# Patient Record
Sex: Female | Born: 1985 | Race: White | Hispanic: No | Marital: Married | Smoking: Current every day smoker
Health system: Southern US, Community
[De-identification: ages and names within clinical notes are randomized; demographics above are authoritative.]

## PROBLEM LIST (undated history)

## (undated) DIAGNOSIS — F419 Anxiety disorder, unspecified: Secondary | ICD-10-CM

---
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
## 2012-05-09 ENCOUNTER — Ambulatory Visit (INDEPENDENT_AMBULATORY_CARE_PROVIDER_SITE_OTHER): Payer: 59 | Admitting: Family Medicine

## 2012-05-09 ENCOUNTER — Ambulatory Visit: Payer: 59

## 2012-05-09 VITALS — BP 126/88 | HR 87 | Temp 98.5°F | Resp 16 | Ht 67.6 in | Wt 108.8 lb

## 2012-05-09 DIAGNOSIS — M79673 Pain in unspecified foot: Secondary | ICD-10-CM

## 2012-05-09 DIAGNOSIS — M79609 Pain in unspecified limb: Secondary | ICD-10-CM

## 2012-05-09 DIAGNOSIS — S9030XA Contusion of unspecified foot, initial encounter: Secondary | ICD-10-CM

## 2012-05-09 MED ORDER — MELOXICAM 7.5 MG PO TABS: 7.5000 mg | ORAL_TABLET | Freq: Every day | ORAL | Status: DC

## 2012-05-09 NOTE — Progress Notes (Signed)
  Subjective:    Patient ID: Alexandria Bell, female    DOB: 04-Dec-1985, 27 y.o.   MRN: [id number]  HPI Alexandria Bell is a 27 y.o. female Stomping L foot about 11 days ago - out of frustration - barefoot. Sore in heel.  No bruising, but some swelling. Wrapping bottom with ace bandage - able to walk, but limping.   Had been taking some pain medicine form having tooth pulled - 8 days ago - wasn't having any pain in foot on the pain medicine - sick on the pain medicine - hydrocodone. Noticed more pain in heel off pain meds. No prior heel injury, no hx of stress fx or other fx.  [employer] work - on Hospital doctor - standing at work, walking all day.   NG:[id number] ibuprofen.   Review of Systems  Constitutional: Negative for fever and chills.  Musculoskeletal: Positive for arthralgias (L heel).  Skin: Negative for rash.       Objective:   Physical Exam  Constitutional: She is oriented to person, place, and time. She appears well-developed and well-nourished. No distress.  Pulmonary/Chest: Effort normal.  Musculoskeletal:       Feet:  Neurological: She is alert and oriented to person, place, and time.  Skin: Skin is warm and dry. No rash noted.  Psychiatric: She has a normal mood and affect. Her behavior is normal.     .[facility] reading (PRIMARY) by  Dr. Neva Seat: L calcaneus: NAD. No fx identified.    Assessment & Plan:  Alexandria Bell is a 27 y.o. female 1. Heel pain  DG Os Calcis Left, meloxicam (MOBIC) 7.5 MG tablet  2. Contusion of heel  meloxicam (MOBIC) 7.5 MG tablet  overall reassuring exam - tx as below, but advised MRI may be needed if not improving with cushioning and relative rest to r/o stress injury. rtc precautions discussed.   Patient Instructions  Cushion the heel with a silicone or other cushioned heel pad.  One option is a Tuli heel cup.  They should have those at [facility] [employer] [facility]. Decrease walking as able, ibuprofen over the counter as  needed OR prescribed pain medicine - do not combine these. If not improving in next 2 weeks - recheck.  Return to the clinic or go to the nearest emergency room if any of your symptoms worsen or new symptoms occur.

## 2012-05-09 NOTE — Patient Instructions (Signed)
Cushion the heel with a silicone or other cushioned heel pad.  One option is a Tuli heel cup.  They should have those at [employer]. Decrease walking as able, ibuprofen over the counter as needed OR prescribed pain medicine - do not combine these. If not improving in next 2 weeks - recheck.  Return to the clinic or go to the nearest emergency room if any of your symptoms worsen or new symptoms occur.

## 2012-11-15 ENCOUNTER — Emergency Department (HOSPITAL_COMMUNITY)
Admission: EM | Admit: 2012-11-15 | Discharge: 2012-11-15 | Disposition: A | Discharge status: Home / Self Care | Payer: 59 | Attending: Emergency Medicine | Admitting: Emergency Medicine

## 2012-11-15 ENCOUNTER — Encounter (HOSPITAL_COMMUNITY): Payer: Self-pay

## 2012-11-15 DIAGNOSIS — R42 Dizziness and giddiness: Secondary | ICD-10-CM | POA: Insufficient documentation

## 2012-11-15 DIAGNOSIS — F419 Anxiety disorder, unspecified: Secondary | ICD-10-CM

## 2012-11-15 DIAGNOSIS — F411 Generalized anxiety disorder: Secondary | ICD-10-CM | POA: Insufficient documentation

## 2012-11-15 DIAGNOSIS — R0789 Other chest pain: Secondary | ICD-10-CM | POA: Insufficient documentation

## 2012-11-15 DIAGNOSIS — Z3202 Encounter for pregnancy test, result negative: Secondary | ICD-10-CM | POA: Insufficient documentation

## 2012-11-15 DIAGNOSIS — R0602 Shortness of breath: Secondary | ICD-10-CM | POA: Insufficient documentation

## 2012-11-15 DIAGNOSIS — Z046 Encounter for general psychiatric examination, requested by authority: Secondary | ICD-10-CM | POA: Insufficient documentation

## 2012-11-15 DIAGNOSIS — R450 Nervousness: Secondary | ICD-10-CM | POA: Insufficient documentation

## 2012-11-15 HISTORY — DX: Anxiety disorder, unspecified: F41.9

## 2012-11-15 LAB — ACETAMINOPHEN LEVEL: Acetaminophen (Tylenol), Serum: <15 ug/mL (ref 10–30)

## 2012-11-15 LAB — COMPREHENSIVE METABOLIC PANEL
ALT: 14 U/L (ref 0–35)
Alkaline Phosphatase: 101 U/L (ref 39–117)
CO2: 21 mEq/L (ref 19–32)
GFR calc Af Amer: >90 mL/min (ref >90)
GFR calc non Af Amer: >90 mL/min (ref >90)
Glucose, Bld: 89 mg/dL (ref 70–99)
Potassium: 3.4 mEq/L — ABNORMAL LOW (ref 3.5–5.1)
Sodium: 138 mEq/L (ref 135–145)

## 2012-11-15 LAB — CBC
Hemoglobin: 15.1 g/dL — ABNORMAL HIGH (ref 12.0–15.0)
RBC: 4.85 MIL/uL (ref 3.87–5.11)

## 2012-11-15 LAB — RAPID URINE DRUG SCREEN, HOSP PERFORMED
Barbiturates: NOT DETECTED
Tetrahydrocannabinol: POSITIVE — AB

## 2012-11-15 MED ORDER — LORAZEPAM 1 MG PO TABS
1.0000 mg | ORAL_TABLET | ORAL | Status: DC | PRN
  Administered 2012-11-15: 1 mg via ORAL
  Filled 2012-11-15: qty 1

## 2012-11-15 NOTE — ED Notes (Addendum)
Pt was very upset after Psychiatrist left her room and asked to be discharged.  Jessie Foot, with ACT team, gave Pt resources.

## 2012-11-15 NOTE — ED Notes (Signed)
Psych MD at bedside

## 2012-11-15 NOTE — ED Notes (Signed)
Per Triage RN, Pt does not fully understand the process for Medical Clearance, but she sts that she needs "help."

## 2012-11-15 NOTE — Progress Notes (Addendum)
Pt confirms pcp Dr Lonie Peak  EPIC updated

## 2012-11-15 NOTE — ED Provider Notes (Signed)
CSN: [id number]     Arrival date & time 11/15/12  1052 History     First MD Initiated Contact with Patient 11/15/12 1053     Chief Complaint  Patient presents with  . Panic Attack  . Medical Clearance    HPI Comments: Pt tearful and hyperventilating.  Mother assists with history.  The history is provided by the patient.  Pt has husband with MS, and 2 special needs children.  Recent LOA from work for anxiety, and on Lexapro from PCP.  Back to work now, and today with marked crescendo in anxiety.  Hyper ventilating and carpal pedal spasm on arrival.  Denies drugs use x occasional Marijuana.  Past Medical History  Diagnosis Date  . Anxiety    History reviewed. No pertinent past surgical history. Family History  Problem Relation Age of Onset  . Cancer Father    History  Substance Use Topics  . Smoking status: Current Every Day Smoker -- 1.00 packs/day for 13 years    Types: Cigarettes  . Smokeless tobacco: Never Used  . Alcohol Use: No   OB History   Grav Para Term Preterm Abortions TAB SAB Ect Mult Living                 Review of Systems  Constitutional: Negative for fever, chills and fatigue.  HENT: Positive for voice change. Negative for trouble swallowing.   Eyes: Negative for visual disturbance.  Respiratory: Positive for chest tightness and shortness of breath.   Cardiovascular: Positive for chest pain.  Gastrointestinal: Negative for nausea and abdominal pain.  Genitourinary: Negative for dysuria.  Musculoskeletal: Negative for back pain.  Skin: Negative for rash.  Neurological: Positive for dizziness. Negative for seizures.  Hematological: Does not bruise/bleed easily.  Psychiatric/Behavioral: The patient is nervous/anxious and is hyperactive.     Allergies  Review of patient's allergies indicates no known allergies.  Home Medications   Current Outpatient Rx  Name  Route  Sig  Dispense  Refill  . Aspirin-Salicylamide-Caffeine (BC HEADACHE POWDER PO)  Oral   Take 1 application by mouth every 8 (eight) hours as needed (pain).         Marland Kitchen escitalopram (LEXAPRO) 10 MG tablet   Oral   Take 10 mg by mouth every evening.          BP 153/111  Pulse 88  Temp(Src) 97.8 F (36.6 C) (Oral)  Resp 36  Ht 5\' 6"  (1.676 m)  Wt 104 lb 4 oz (47.287 kg)  BMI 16.83 kg/m2  SpO2 100% Physical Exam  Constitutional: She appears well-developed and well-nourished.  HENT:  Head: Normocephalic and atraumatic.  Eyes: Pupils are equal, round, and reactive to light.  Cardiovascular:  No extrasystoles are present. Tachycardia present.   Pulmonary/Chest: Accessory muscle usage present. Tachypnea noted. She is in respiratory distress. She has no decreased breath sounds. She has no wheezes. She has no rhonchi. She has no rales.  Abdominal: Soft.  Psychiatric: Thought content normal. Her mood appears anxious. Her speech is rapid and/or pressured. She is hyperactive. Cognition and memory are not impaired. She does not express impulsivity.    ED Course   Procedures (including critical care time)  Labs Reviewed  CBC - Abnormal; Notable for the following:    Hemoglobin 15.1 (*)    All other components within normal limits  COMPREHENSIVE METABOLIC PANEL - Abnormal; Notable for the following:    Potassium 3.4 (*)    All other components within normal limits  SALICYLATE LEVEL - Abnormal; Notable for the following:    Salicylate Lvl <2.0 (*)    All other components within normal limits  ACETAMINOPHEN LEVEL  ETHANOL  URINE RAPID DRUG SCREEN (HOSP PERFORMED)  POCT PREGNANCY, URINE   No results found. 1. Anxiety     MDM  Pt spoke with Psychiatrist.  She states that she "did not like the way that he asked me questions" and requests immediate discharge.  Pt not Homicidal, suicidal, and not gravely disabled.  Calm and appropriate demeanor here.  Pt DC'd with multiple outpatient Mental Health referrals.  Claudean Kinds, MD 11/15/12 1255

## 2012-11-15 NOTE — ED Notes (Signed)
Patient c/o panic attack and has been taking Lexapro. Patient states she needs help to help her take care of her family. Patient is tearful and  and she has been struggling at home with her family.Patient states she is unsure if she needs medical clearance or not. Patient repeatedly states she has her mind, but her body is not right. Patient's mother is with her.

## 2012-11-15 NOTE — ED Notes (Signed)
Pt escorted to discharge window. Verbalized understanding discharge instructions. In no acute distress.  Pt refused d/c vitals.

## 2013-02-21 ENCOUNTER — Other Ambulatory Visit: Payer: Self-pay

## 2013-07-03 IMAGING — CR DG OS CALCIS 2+V*L*
1 series · 1 of 1 positions shown · non-contrast
Comparison: None.

CLINICAL DATA: Heel pain.

LEFT OS CALCIS - 2+ VIEW

[axial]
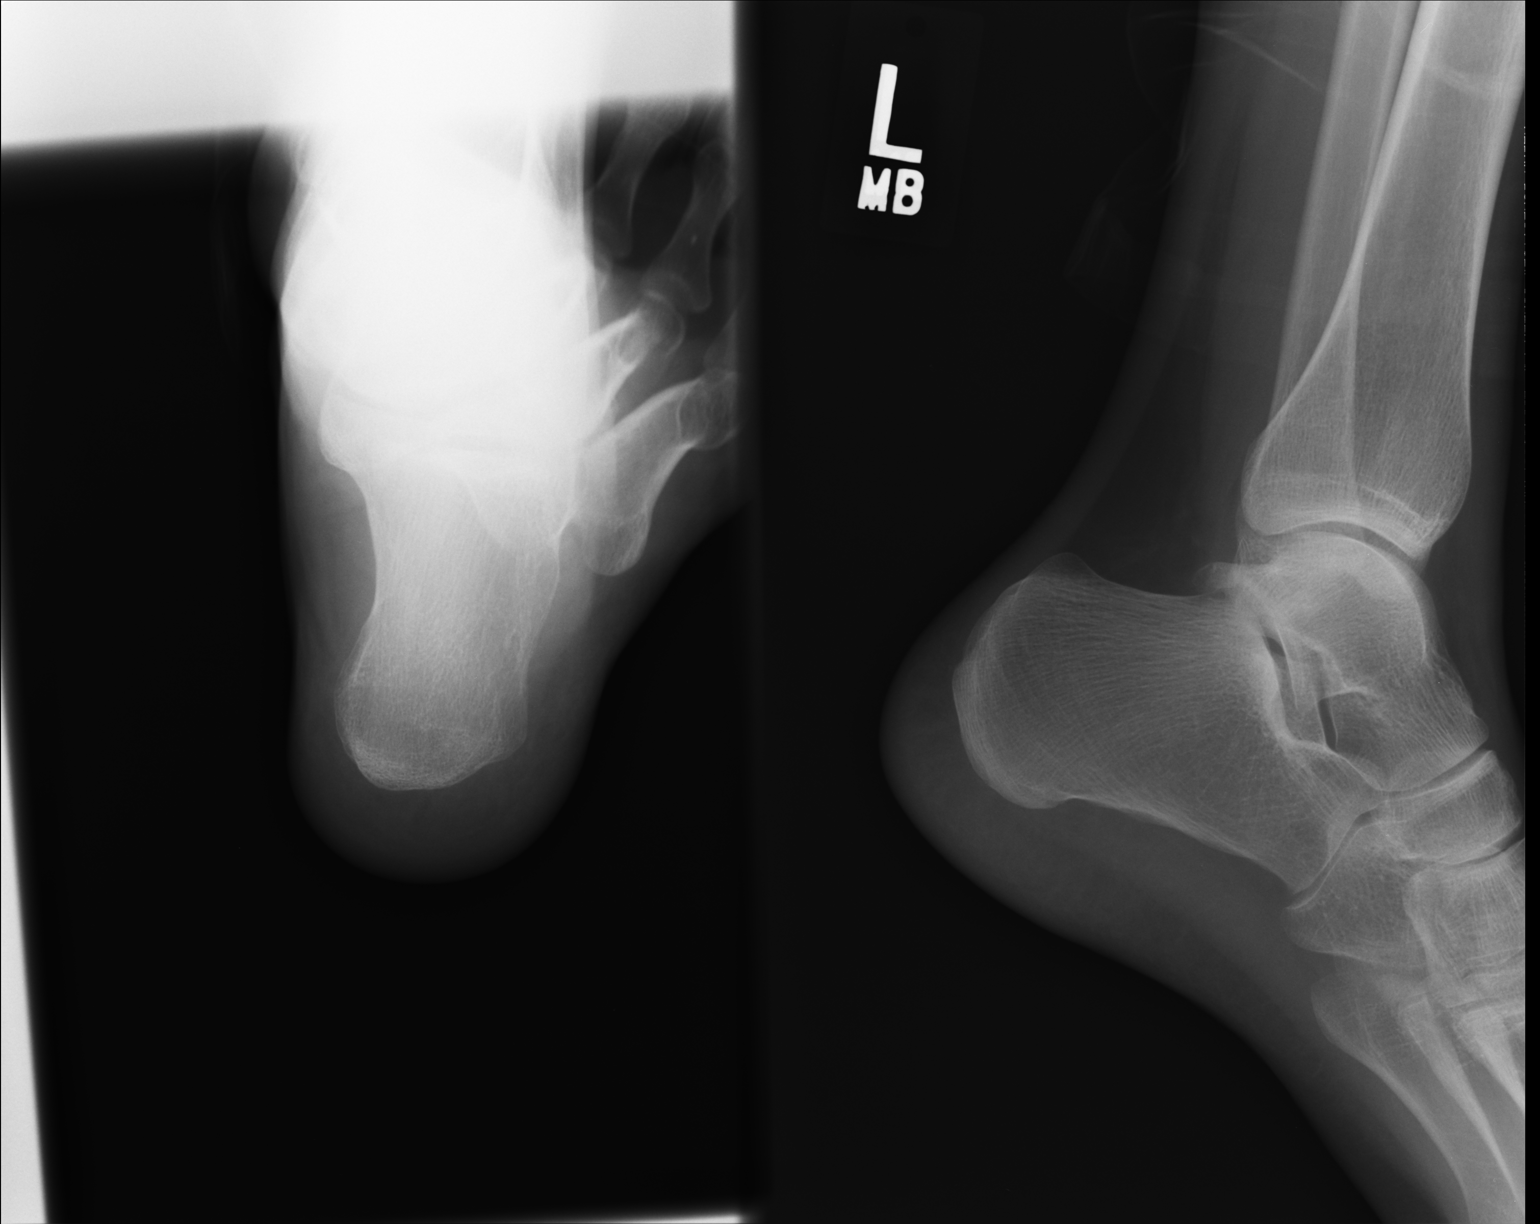

[1 of 1 positions shown; findings below may reference images not displayed]

FINDINGS: Imaged bones, joints and soft tissues appear normal.
IMPRESSION: Negative exam.

## 2017-01-18 ENCOUNTER — Telehealth: Payer: Self-pay | Admitting: Obstetrics & Gynecology

## 2017-01-18 NOTE — Telephone Encounter (Signed)
[facility] referring for Papsmear of cervix with ACUS, Cannot exclude HGSIL. Attempted to reach patient unable to leave voicemail

## 2017-01-24 NOTE — Telephone Encounter (Signed)
LVM for patient to call back to be schedule °

## 2017-01-25 NOTE — Telephone Encounter (Signed)
Unable to lvm due to voicemail box full
